# Patient Record
Sex: Male | Born: 1935 | Race: White | Hispanic: No | Marital: Married | Smoking: Former smoker
Health system: Southern US, Community
[De-identification: ages and names within clinical notes are randomized; demographics above are authoritative.]

## PROBLEM LIST (undated history)

## (undated) DIAGNOSIS — H409 Unspecified glaucoma: Secondary | ICD-10-CM

## (undated) DIAGNOSIS — I10 Essential (primary) hypertension: Secondary | ICD-10-CM

## (undated) DIAGNOSIS — N400 Enlarged prostate without lower urinary tract symptoms: Secondary | ICD-10-CM

## (undated) DIAGNOSIS — R630 Anorexia: Secondary | ICD-10-CM

## (undated) HISTORY — PX: HERNIA REPAIR: SHX51

---

## 2013-04-23 ENCOUNTER — Ambulatory Visit: Payer: Self-pay | Admitting: Family Medicine

## 2015-04-15 ENCOUNTER — Other Ambulatory Visit: Payer: Self-pay | Admitting: Family Medicine

## 2015-04-15 ENCOUNTER — Ambulatory Visit
Admission: RE | Admit: 2015-04-15 | Discharge: 2015-04-15 | Disposition: A | Discharge status: Home / Self Care | Payer: Medicare Other | Source: Ambulatory Visit | Attending: Family Medicine | Admitting: Family Medicine

## 2015-04-15 DIAGNOSIS — D72829 Elevated white blood cell count, unspecified: Secondary | ICD-10-CM | POA: Diagnosis present

## 2015-04-15 DIAGNOSIS — J449 Chronic obstructive pulmonary disease, unspecified: Secondary | ICD-10-CM | POA: Diagnosis not present

## 2015-04-21 ENCOUNTER — Ambulatory Visit: Payer: Medicare Other

## 2015-04-21 ENCOUNTER — Ambulatory Visit
Admission: EM | Admit: 2015-04-21 | Discharge: 2015-04-21 | Disposition: A | Discharge status: Home / Self Care | Payer: Medicare Other | Attending: Family Medicine | Admitting: Family Medicine

## 2015-04-21 DIAGNOSIS — Z87891 Personal history of nicotine dependence: Secondary | ICD-10-CM | POA: Insufficient documentation

## 2015-04-21 DIAGNOSIS — W458XXA Other foreign body or object entering through skin, initial encounter: Secondary | ICD-10-CM | POA: Diagnosis not present

## 2015-04-21 DIAGNOSIS — S61411A Laceration without foreign body of right hand, initial encounter: Secondary | ICD-10-CM | POA: Diagnosis present

## 2015-04-21 DIAGNOSIS — I10 Essential (primary) hypertension: Secondary | ICD-10-CM | POA: Diagnosis not present

## 2015-04-21 DIAGNOSIS — N400 Enlarged prostate without lower urinary tract symptoms: Secondary | ICD-10-CM | POA: Insufficient documentation

## 2015-04-21 DIAGNOSIS — H409 Unspecified glaucoma: Secondary | ICD-10-CM | POA: Insufficient documentation

## 2015-04-21 HISTORY — DX: Anorexia: R63.0

## 2015-04-21 HISTORY — DX: Essential (primary) hypertension: I10

## 2015-04-21 HISTORY — DX: Benign prostatic hyperplasia without lower urinary tract symptoms: N40.0

## 2015-04-21 HISTORY — DX: Unspecified glaucoma: H40.9

## 2015-04-21 MED ORDER — BACITRACIN ZINC 500 UNIT/GM EX OINT: TOPICAL_OINTMENT | Freq: Two times a day (BID) | CUTANEOUS | Status: DC

## 2015-04-21 NOTE — Discharge Instructions (Signed)
Keep clean. Clean daily with soap and water, rinse, then pat dry and apply thin layer of topical antibiotic ointment. Elevate today.   REturn to Urgent care or Primary care physician in 3 days for wound check. Return to Urgent care or primary care physician office in 7 days for suture removal. Return sooner for redness, drainage, new or worsening concerns.   Laceration Care, Adult A laceration is a cut that goes through all layers of the skin. The cut goes into the tissue beneath the skin. HOME CARE For stitches (sutures) or staples:  Keep the cut clean and dry.  If you have a bandage (dressing), change it at least once a day. Change the bandage if it gets wet or dirty, or as told by your doctor.  Wash the cut with soap and water 2 times a day. Rinse the cut with water. Pat it dry with a clean towel.  Put a thin layer of medicated cream on the cut as told by your doctor.  You may shower after the first 24 hours. Do not soak the cut in water until the stitches are removed.  Only take medicines as told by your doctor.  Have your stitches or staples removed as told by your doctor. For skin adhesive strips:  Keep the cut clean and dry.  Do not get the strips wet. You may take a bath, but be careful to keep the cut dry.  If the cut gets wet, pat it dry with a clean towel.  The strips will fall off on their own. Do not remove the strips that are still stuck to the cut. For wound glue:  You may shower or take baths. Do not soak or scrub the cut. Do not swim. Avoid heavy sweating until the glue falls off on its own. After a shower or bath, pat the cut dry with a clean towel.  Do not put medicine on your cut until the glue falls off.  If you have a bandage, do not put tape over the glue.  Avoid lots of sunlight or tanning lamps until the glue falls off. Put sunscreen on the cut for the first year to reduce your scar.  The glue will fall off on its own. Do not pick at the glue. You  may need a tetanus shot if:  You cannot remember when you had your last tetanus shot.  You have never had a tetanus shot. If you need a tetanus shot and you choose not to have one, you may get tetanus. Sickness from tetanus can be serious. GET HELP RIGHT AWAY IF:   Your pain does not get better with medicine.  Your arm, hand, leg, or foot loses feeling (numbness) or changes color.  Your cut is bleeding.  Your joint feels weak, or you cannot use your joint.  You have painful lumps on your body.  Your cut is red, puffy (swollen), or painful.  You have a red line on the skin near the cut.  You have yellowish-white fluid (pus) coming from the cut.  You have a fever.  You have a bad smell coming from the cut or bandage.  Your cut breaks open before or after stitches are removed.  You notice something coming out of the cut, such as wood or glass.  You cannot move a finger or toe. MAKE SURE YOU:   Understand these instructions.  Will watch your condition.  Will get help right away if you are not doing well or get  worse. Document Released: 01/19/2008 Document Revised: 10/25/2011 Document Reviewed: 01/26/2011 [facility] Patient Information 2015 [place], [place]. This information is not intended to replace advice given to you by your health care provider. Make sure you discuss any questions you have with your health care provider.

## 2015-04-21 NOTE — ED Provider Notes (Signed)
[facility] Emergency Department Provider Note  ____________________________________________  Time seen: Approximately 3:25 PM  I have reviewed the triage vital signs and the nursing notes.   HISTORY  Chief Complaint Laceration   HPI Baudelio Karnes is a 81 y.o. male presents with wife at bedside for complaints of right hand laceration. Patient and wife reports patient had his hand holding to the top of the car door frame where window closes. Wife reports she was closing window and didn't realize he had his hand there and the window partially closed on his hand causing laceration. Reports occurred just prior to arrival. Patient states right hand was mildly painful but now does not hurt. Denies other pain or injuries. Reports last tetanus immunization was 2 years ago.   PCP: Quillian Quince   Past Medical History  Diagnosis Date  . Hypertension   . BPH (benign prostatic hyperplasia)   . Glaucoma   . Anorexia     There are no active problems to display for this patient.   Past Surgical History  Procedure Laterality Date  . Hernia repair      No current outpatient prescriptions on file. Wife reports "blood pressure medication and an antibiotic because his white count was up" Denies blood thinners.  Allergies Review of patient's allergies indicates no known allergies.  History reviewed. No pertinent family history.  Social History Social History  Substance Use Topics  . Smoking status: Former Games developer  . Smokeless tobacco: None  . Alcohol Use: Yes     Comment:  weaning off beer    Review of Systems Constitutional: No fever/chills Eyes: No visual changes. ENT: No sore throat. Cardiovascular: Denies chest pain. Respiratory: Denies shortness of breath. Gastrointestinal: No abdominal pain.  No nausea, no vomiting.  No diarrhea.  No constipation. Genitourinary: Negative for dysuria. Musculoskeletal: Negative for back pain. Skin: Negative for rash. Right hand  laceration.  Neurological: Negative for headaches, focal weakness or numbness.  10-point ROS otherwise negative.  ____________________________________________   PHYSICAL EXAM:  VITAL SIGNS: ED Triage Vitals  Enc Vitals Group     BP 04/21/15 1403 131/64 mmHg     Pulse Rate 04/21/15 1403 92     Resp 04/21/15 1403 17     Temp 04/21/15 1403 97.2 F (36.2 C)     Temp Source 04/21/15 1403 Tympanic     SpO2 04/21/15 1403 100 %     Weight 04/21/15 1403 95 lb (43.092 kg)     Height 04/21/15 1403 5\' 4"  (1.626 m)     Head Cir --      Peak Flow --      Pain Score 04/21/15 1408 5     Pain Loc --      Pain Edu? --      Excl. in GC? --     Constitutional: Alert and oriented. Well appearing and in no acute distress. Eyes: Conjunctivae are normal. PERRL. EOMI. Head: Atraumatic.  Nose: No congestion/rhinnorhea.  Mouth/Throat: Mucous membranes are moist.   Neck: No stridor.  No cervical spine tenderness to palpation. Hematological/Lymphatic/Immunilogical: No cervical lymphadenopathy. Cardiovascular: Normal rate, regular rhythm. Grossly normal heart sounds.  Good peripheral circulation. Respiratory: Normal respiratory effort.  No retractions. Lungs CTAB. Musculoskeletal: No lower or upper extremity tenderness nor edema.  No joint effusions. Bilateral pedal pulses equal and easily palpated.  Except: right dorsal hand mild TTP with laceration present, right hand no tendon or motor deficit, sensation intact, bilateral hand grips equal, bilateral distal radial pulses equal  and easily palpated. Dorsal right hand laceration with 5.5 cm flap laceration. Also with right 4th finger lateral 2 cm superficial skin tear.  Neurologic:  Normal speech and language. No gross focal neurologic deficits are appreciated. No gait instability. Skin:  Skin is warm, dry and intact. No rash noted. Psychiatric: Mood and affect are normal. Speech and behavior are normal.  ____________________________________________    LABS (all labs ordered are listed, but only abnormal results are displayed)  Labs Reviewed - No data to display  RADIOLOGY EXAM: RIGHT HAND - COMPLETE 3+ VIEW  COMPARISON: None.  FINDINGS: Negative for fracture or dislocation. No foreign body. No significant arthropathy. Arterial calcification.  IMPRESSION: Negative for fracture.   Electronically Signed By: Marlan Palau M.D. On: 04/21/2015 14:40  I, Renford Dills, personally viewed and evaluated these images (plain radiographs) as part of my medical decision making.    ____________________________________________   PROCEDURES  Procedure(s) performed:   Procedure explained and verbal consent obtained.  Anesthesia with 1% Lidocaine.  Wound cleansed and irrigated with saline and betadine. Copious irrigation.  Location: right dorsal hand Length  5.5 cm Repaired sterile technique  with x 7 5-0 nylon sutures, simple interrupted.   Location: Right 4th lateral finger Length: 2 cm Cleaned and irrigated with betadine and saline.  No suture indicated. Superficial skin tear repaired with x 2 steri strips.    Antibiotic ointment and dressing applied.  Wound care instructions provided.  Observe for any signs of infection or other problems. Patient tolerated well.   ____________________________________________   INITIAL IMPRESSION / ASSESSMENT AND PLAN / ED COURSE  Pertinent labs & imaging results that were available during my care of the patient were reviewed by me and considered in my medical decision making (see chart for details).  Presents for right hand laceration post car window cutting right hand. Xray of hand negative for fracture. Laceration repaired. PAtient tolerated well. Discussed follow up and return parameters. Wound check in 3 days. Suture removal in 7 days. Patient and wife verbalized understanding and agreed to plan.  ____________________________________________   FINAL CLINICAL  IMPRESSION(S) / ED DIAGNOSES  Final diagnoses:  Hand laceration, right, initial encounter       Renford Dills, NP 04/21/15 1644

## 2015-04-21 NOTE — ED Notes (Addendum)
Passenger in car, wife accidentally closed car window on patient's hand. Has deep v shaped (skin tear) top of right hand. Can see tendon. Been on an unknown antibiotic for unknown infection "somewhere". Being treated also for anorexia. On 2 medications for HTN, Prostate medication and 2 different gtts for glaucoma

## 2016-07-24 ENCOUNTER — Encounter: Payer: Self-pay | Admitting: *Deleted

## 2016-07-24 ENCOUNTER — Emergency Department: Payer: Medicare Other

## 2016-07-24 ENCOUNTER — Emergency Department
Admission: EM | Admit: 2016-07-24 | Discharge: 2016-07-24 | Disposition: A | Discharge status: Other Acute Inpatient Hospital | Payer: Medicare Other | Attending: Emergency Medicine | Admitting: Emergency Medicine

## 2016-07-24 DIAGNOSIS — I10 Essential (primary) hypertension: Secondary | ICD-10-CM | POA: Insufficient documentation

## 2016-07-24 DIAGNOSIS — J181 Lobar pneumonia, unspecified organism: Secondary | ICD-10-CM | POA: Diagnosis not present

## 2016-07-24 DIAGNOSIS — Z87891 Personal history of nicotine dependence: Secondary | ICD-10-CM | POA: Insufficient documentation

## 2016-07-24 DIAGNOSIS — Z79899 Other long term (current) drug therapy: Secondary | ICD-10-CM | POA: Diagnosis not present

## 2016-07-24 DIAGNOSIS — Z7901 Long term (current) use of anticoagulants: Secondary | ICD-10-CM | POA: Insufficient documentation

## 2016-07-24 DIAGNOSIS — R0602 Shortness of breath: Secondary | ICD-10-CM | POA: Diagnosis present

## 2016-07-24 DIAGNOSIS — R791 Abnormal coagulation profile: Secondary | ICD-10-CM | POA: Diagnosis not present

## 2016-07-24 DIAGNOSIS — J189 Pneumonia, unspecified organism: Secondary | ICD-10-CM

## 2016-07-24 LAB — BASIC METABOLIC PANEL
ANION GAP: 10 (ref 5–15)
BUN: 15 mg/dL (ref 6–20)
CALCIUM: 8.7 mg/dL — AB (ref 8.9–10.3)
CO2: 27 mmol/L (ref 22–32)
Chloride: 102 mmol/L (ref 101–111)
Creatinine, Ser: 0.69 mg/dL (ref 0.61–1.24)
Glucose, Bld: 141 mg/dL — ABNORMAL HIGH (ref 65–99)
Potassium: 3.4 mmol/L — ABNORMAL LOW (ref 3.5–5.1)
Sodium: 139 mmol/L (ref 135–145)

## 2016-07-24 LAB — CBC WITH DIFFERENTIAL/PLATELET
BASOS ABS: 0 10*3/uL (ref 0–0.1)
Basophils Relative: 0 %
EOS PCT: 0 %
Eosinophils Absolute: 0 10*3/uL (ref 0–0.7)
HEMATOCRIT: 40.7 % (ref 40.0–52.0)
Hemoglobin: 13.6 g/dL (ref 13.0–18.0)
LYMPHS PCT: 3 %
Lymphs Abs: 0.7 10*3/uL — ABNORMAL LOW (ref 1.0–3.6)
MCH: 31.8 pg (ref 26.0–34.0)
MCHC: 33.4 g/dL (ref 32.0–36.0)
MCV: 95.4 fL (ref 80.0–100.0)
Monocytes Absolute: 0.8 10*3/uL (ref 0.2–1.0)
Monocytes Relative: 3 %
NEUTROS ABS: 25.8 10*3/uL — AB (ref 1.4–6.5)
Neutrophils Relative %: 94 %
PLATELETS: 395 10*3/uL (ref 150–440)
RBC: 4.27 MIL/uL — AB (ref 4.40–5.90)
RDW: 14.3 % (ref 11.5–14.5)
WBC: 27.3 10*3/uL — AB (ref 3.8–10.6)

## 2016-07-24 LAB — BRAIN NATRIURETIC PEPTIDE: B Natriuretic Peptide: 312 pg/mL — ABNORMAL HIGH (ref 0.0–100.0)

## 2016-07-24 LAB — LACTIC ACID, PLASMA
LACTIC ACID, VENOUS: 1.7 mmol/L (ref 0.5–1.9)
Lactic Acid, Venous: 1.2 mmol/L (ref 0.5–1.9)

## 2016-07-24 LAB — PROTIME-INR
INR: 1.65
Prothrombin Time: 19.7 seconds — ABNORMAL HIGH (ref 11.4–15.2)

## 2016-07-24 MED ORDER — ENOXAPARIN SODIUM 60 MG/0.6ML ~~LOC~~ SOLN
1.0000 mg/kg | Freq: Once | SUBCUTANEOUS | Status: AC
  Administered 2016-07-24: 16:00:00 55 mg via SUBCUTANEOUS
  Filled 2016-07-24: qty 0.6

## 2016-07-24 MED ORDER — PIPERACILLIN-TAZOBACTAM 3.375 G IVPB 30 MIN
3.3750 g | Freq: Once | INTRAVENOUS | Status: AC
  Administered 2016-07-24: 3.375 g via INTRAVENOUS
  Filled 2016-07-24: qty 50

## 2016-07-24 MED ORDER — VANCOMYCIN HCL IN DEXTROSE 1-5 GM/200ML-% IV SOLN
1000.0000 mg | Freq: Once | INTRAVENOUS | Status: AC
  Administered 2016-07-24: 1000 mg via INTRAVENOUS
  Filled 2016-07-24: qty 200

## 2016-07-24 MED ORDER — SODIUM CHLORIDE 0.9 % IV BOLUS (SEPSIS)
1000.0000 mL | Freq: Once | INTRAVENOUS | Status: AC
  Administered 2016-07-24: 1000 mL via INTRAVENOUS

## 2016-07-24 NOTE — ED Notes (Signed)
Patient left via [facility]. 

## 2016-07-24 NOTE — ED Notes (Signed)
Family declined chest x-ray at this time. Family states patient was just discharged from [facility] yesterday, and they want him transferred back there for treatment. Dr. Shaune PollackLord aware.

## 2016-07-24 NOTE — ED Triage Notes (Signed)
Per EMS report, EMS was called to [facility] for c/o shortness of breath. Patient was recently diagnosed with aspiration pneumonia. Pulse ox was 82% on 2L upon EMS arrival. Patient was given Duoneb treatment and albuterol treatment and 125mg  Solumedrol IV enroute. Patient has faint wheezing, not much air exchanged noted. Patient was suctioned with yankauer upon arrival due to gurgling and clear mucous was obtained.

## 2016-07-24 NOTE — ED Notes (Signed)
Patient suctioned with yankauer of clear secretions.

## 2016-07-24 NOTE — ED Notes (Signed)
Patient suctioned clear mucous. Lemon swabs were used to freshen mouth. Patient tolerated procedure well.

## 2016-07-24 NOTE — ED Provider Notes (Addendum)
[facility] Emergency Department Provider Note ____________________________________________   I have reviewed the triage vital signs and the triage nursing note.  HISTORY  Chief Complaint Shortness of Breath   Historian Patient is a limited historian based on his muffled voice due to breathing issues Wife and daughter provides most of the history  HPI Tyler Francis is a 82 y.o. male brought in by EMS from nursing home for worsening trouble breathing, gurgling with breathing, and new decreased O2 sat reported at 80% on room air. Patient was discharged yesterday from [facility] after acute hypoxic and hypercarbic respiratory failure requiring BiPAP and then weaned to nasal cannula and then to room air prior to discharge. Review of discharge summary and discussion with the family indicates patient had a likely choking episode followed by a diagnosis of aspiration pneumonia, multiple pulmonary emboli and started on Coumadin, new onset atrial fibrillation and started on diltiazem/Coumadin and discharged to nursing home yesterday.  Family states he was doing much better yesterday and the day before, and this morning looked much worse in terms of a gurgling sound with each breath, patient's complaint of shortness of breath, and certainly the oxygen requirement.  Patient denies chest pain. There is no report of new fevers.    Past Medical History:  Diagnosis Date  . Anorexia   . BPH (benign prostatic hyperplasia)   . Glaucoma   . Hypertension     There are no active problems to display for this patient.   Past Surgical History:  Procedure Laterality Date  . HERNIA REPAIR      Prior to Admission medications   Medication Sig Start Date End Date Taking? Authorizing Provider  acetaminophen (TYLENOL) 325 MG tablet Take 650 mg by mouth every 6 (six) hours as needed.   Yes Historical Provider, MD  albuterol (PROVENTIL HFA;VENTOLIN HFA) 108 (90 Base) MCG/ACT  inhaler Inhale 2 puffs into the lungs every 6 (six) hours as needed for wheezing or shortness of breath.   Yes Historical Provider, MD  amoxicillin-clavulanate (AUGMENTIN) 875-125 MG tablet Take 1 tablet by mouth daily.   Yes Historical Provider, MD  brimonidine (ALPHAGAN) 0.2 % ophthalmic solution Place 1 drop into both eyes daily.   Yes Historical Provider, MD  diltiazem (DILACOR XR) 180 MG 24 hr capsule Take 180 mg by mouth daily.   Yes Historical Provider, MD  dorzolamide-timolol (COSOPT) 22.3-6.8 MG/ML ophthalmic solution Place 1 drop into both eyes 2 (two) times daily.   Yes Historical Provider, MD  enoxaparin (LOVENOX) 40 MG/0.4ML injection Inject 40 mg into the skin 2 (two) times daily. At 0900 and 1800   Yes Historical Provider, MD  furosemide (LASIX) 20 MG tablet Take 20 mg by mouth as needed (swelling).   Yes Historical Provider, MD  megestrol (MEGACE) 400 MG/10ML suspension Take 400 mg by mouth daily.   Yes Historical Provider, MD  tamsulosin (FLOMAX) 0.4 MG CAPS capsule Take 0.4 mg by mouth at bedtime.   Yes Historical Provider, MD  traZODone (DESYREL) 150 MG tablet Take 150 mg by mouth at bedtime.   Yes Historical Provider, MD  warfarin (COUMADIN) 2.5 MG tablet Take 2.5 mg by mouth daily.   Yes Historical Provider, MD    No Known Allergies  No family history on file.  Social History Social History  Substance Use Topics  . Smoking status: Former Games developermoker  . Smokeless tobacco: Not on file  . Alcohol use Yes     Comment:  weaning off beer  Review of Systems  Constitutional: Negative for fever. Eyes: Negative for visual changes. ENT: Negative for sore throat. Cardiovascular: Negative for chest pain. Respiratory: Positive for shortness of breath. Gastrointestinal: Negative for abdominal pain, vomiting and diarrhea. Genitourinary:  Musculoskeletal: Negative for back pain. Skin: Negative for rash. Neurological: Negative for headache. 10 point Review of Systems otherwise  negative ____________________________________________   PHYSICAL EXAM:  VITAL SIGNS: ED Triage Vitals  Enc Vitals Group     BP 07/24/16 1121 136/81     Pulse Rate 07/24/16 1121 (!) 106     Resp 07/24/16 1121 20     Temp 07/24/16 1121 (!) 96.8 F (36 C)     Temp Source 07/24/16 1121 Axillary     SpO2 07/24/16 1121 100 %     Weight 07/24/16 1123 122 lb (55.3 kg)     Height --      Head Circumference --      Peak Flow --      Pain Score --      Pain Loc --      Pain Edu? --      Excl. in GC? --      Constitutional: Alert and Cooperative.  HEENT   Head: Normocephalic and atraumatic.      Eyes: Conjunctivae are normal. PERRL. Normal extraocular movements.      Ears:         Nose: No congestion/rhinnorhea.   Mouth/Throat: Mucous membranes are moderately dry.   Neck: No stridor. Cardiovascular/Chest: Tachycardic irregularly irregular rhythm.  No murmurs, rubs, or gallops. Respiratory: Some gurgling when patient is talking, very thick raspy voice, and rhonchi throughout all lung fields. No tachypnea retractions. Gastrointestinal: Soft. No distention, no guarding, no rebound. Nontender.  Thin  Genitourinary/rectal:Deferred Musculoskeletal: Nontender with normal range of motion in all extremities. No joint effusions.  No lower extremity tenderness.  No edema. Neurologic:  No facial droop, no receptive aphasia. No gross or focal neurologic deficits are appreciated. Skin:  Skin is warm, dry and intact. No rash noted. Psychiatric: Mood and affect are normal. Speech and behavior are normal. Patient exhibits appropriate insight and judgment.   ____________________________________________  LABS (pertinent positives/negatives)  Labs Reviewed  CBC WITH DIFFERENTIAL/PLATELET - Abnormal; Notable for the following:       Result Value   WBC 27.3 (*)    RBC 4.27 (*)    Neutro Abs 25.8 (*)    Lymphs Abs 0.7 (*)    All other components within normal limits  BRAIN NATRIURETIC  PEPTIDE - Abnormal; Notable for the following:    B Natriuretic Peptide 312.0 (*)    All other components within normal limits  BASIC METABOLIC PANEL - Abnormal; Notable for the following:    Potassium 3.4 (*)    Glucose, Bld 141 (*)    Calcium 8.7 (*)    All other components within normal limits  PROTIME-INR - Abnormal; Notable for the following:    Prothrombin Time 19.7 (*)    All other components within normal limits  CULTURE, BLOOD (ROUTINE X 2)  CULTURE, BLOOD (ROUTINE X 2)  LACTIC ACID, PLASMA  LACTIC ACID, PLASMA    ____________________________________________    EKG I, Governor Rooksebecca Elexia Friedt, MD, the attending physician have personally viewed and interpreted all ECGs.  105 bpm. Sinus tachycardia. Narrow QRS. Normal axis. Nonspecific ST and T-wave ____________________________________________  RADIOLOGY All Xrays were viewed by me. Imaging interpreted by Radiologist.  Chest x-ray two-view:  IMPRESSION: Bilateral pleural effusions, right greater than left, with  underlying opacities. Left perihilar opacity, new since August 2016. Recommend follow-up to resolution. __________________________________________  PROCEDURES  Procedure(s) performed: None  Critical Care performed: None  ____________________________________________   ED COURSE / ASSESSMENT AND PLAN  Pertinent labs & imaging results that were available during my care of the patient were reviewed by me and considered in my medical decision making (see chart for details).   Mr. Barco was brought in EMS from nursing home recently been for 1 day after discharge from [facility] with aspiration pneumonia, as well as new onset A. fib, as well as PEs.  Hypoxia improved and stable on 2 L nasal cannula to 96-99%.  No focal neurologic deficits on exam or altered mental status.  No hypotension.  He does have a slight tachycardia. IV fluid bolus started after chest x-ray shows no evidence of pulmonary edema.  No  elevated lactate. Patient given 1 L normal saline bolus, and symptoms be followed clinically for fluid hydration/resuscitation.  We'll give a dose of Lovenox as INR is subtherapeutic.  Per family request after discussion of results and treating for worsening pneumonia with hypoxia, they request transfer back to [facility] for continuity of care. I spoke with on-call hospitalist accepted in transfer.    CONSULTATIONS:  Dr. Oletta Lamas, hospitalist accepted in transfer.   Patient / Family / Caregiver informed of clinical course, medical decision-making process, and agree with plan.  Addended to include, although some raspy voice, I have significant hesitation intubating this gentleman and discussed with family regarding oxygenation and mental status great and plan to treat without pursuing intubation although he did have an episode of aspiration last week, he's been eating and drinking fine.  ___________________________________________   FINAL CLINICAL IMPRESSION(S) / ED DIAGNOSES   Final diagnoses:  Pneumonia of left lung due to infectious organism, unspecified part of lung  Subtherapeutic international normalized ratio (INR)              Note: This dictation was prepared with Dragon dictation. Any transcriptional errors that result from this process are unintentional    Governor Rooks, MD 07/24/16 1459    Governor Rooks, MD 07/24/16 1505    Governor Rooks, MD 07/24/16 1505

## 2016-07-24 NOTE — ED Notes (Signed)
Scant amount of clear secretions suctioned from patient's throat. Patient tolerates suctioning well.

## 2016-07-24 NOTE — ED Notes (Signed)
Patient suctioned clear, mucous secretions.

## 2016-07-29 LAB — CULTURE, BLOOD (ROUTINE X 2)
CULTURE: NO GROWTH
Culture: NO GROWTH

## 2016-08-16 DEATH — deceased

## 2018-08-14 IMAGING — CR DG CHEST 2V
2 series · 2 of 2 positions shown · non-contrast
Comparison: April 15, 2015

CLINICAL DATA: Possible aspiration. Recent discharge from hospital.

EXAM:
CHEST  2 VIEW

[chest lat]
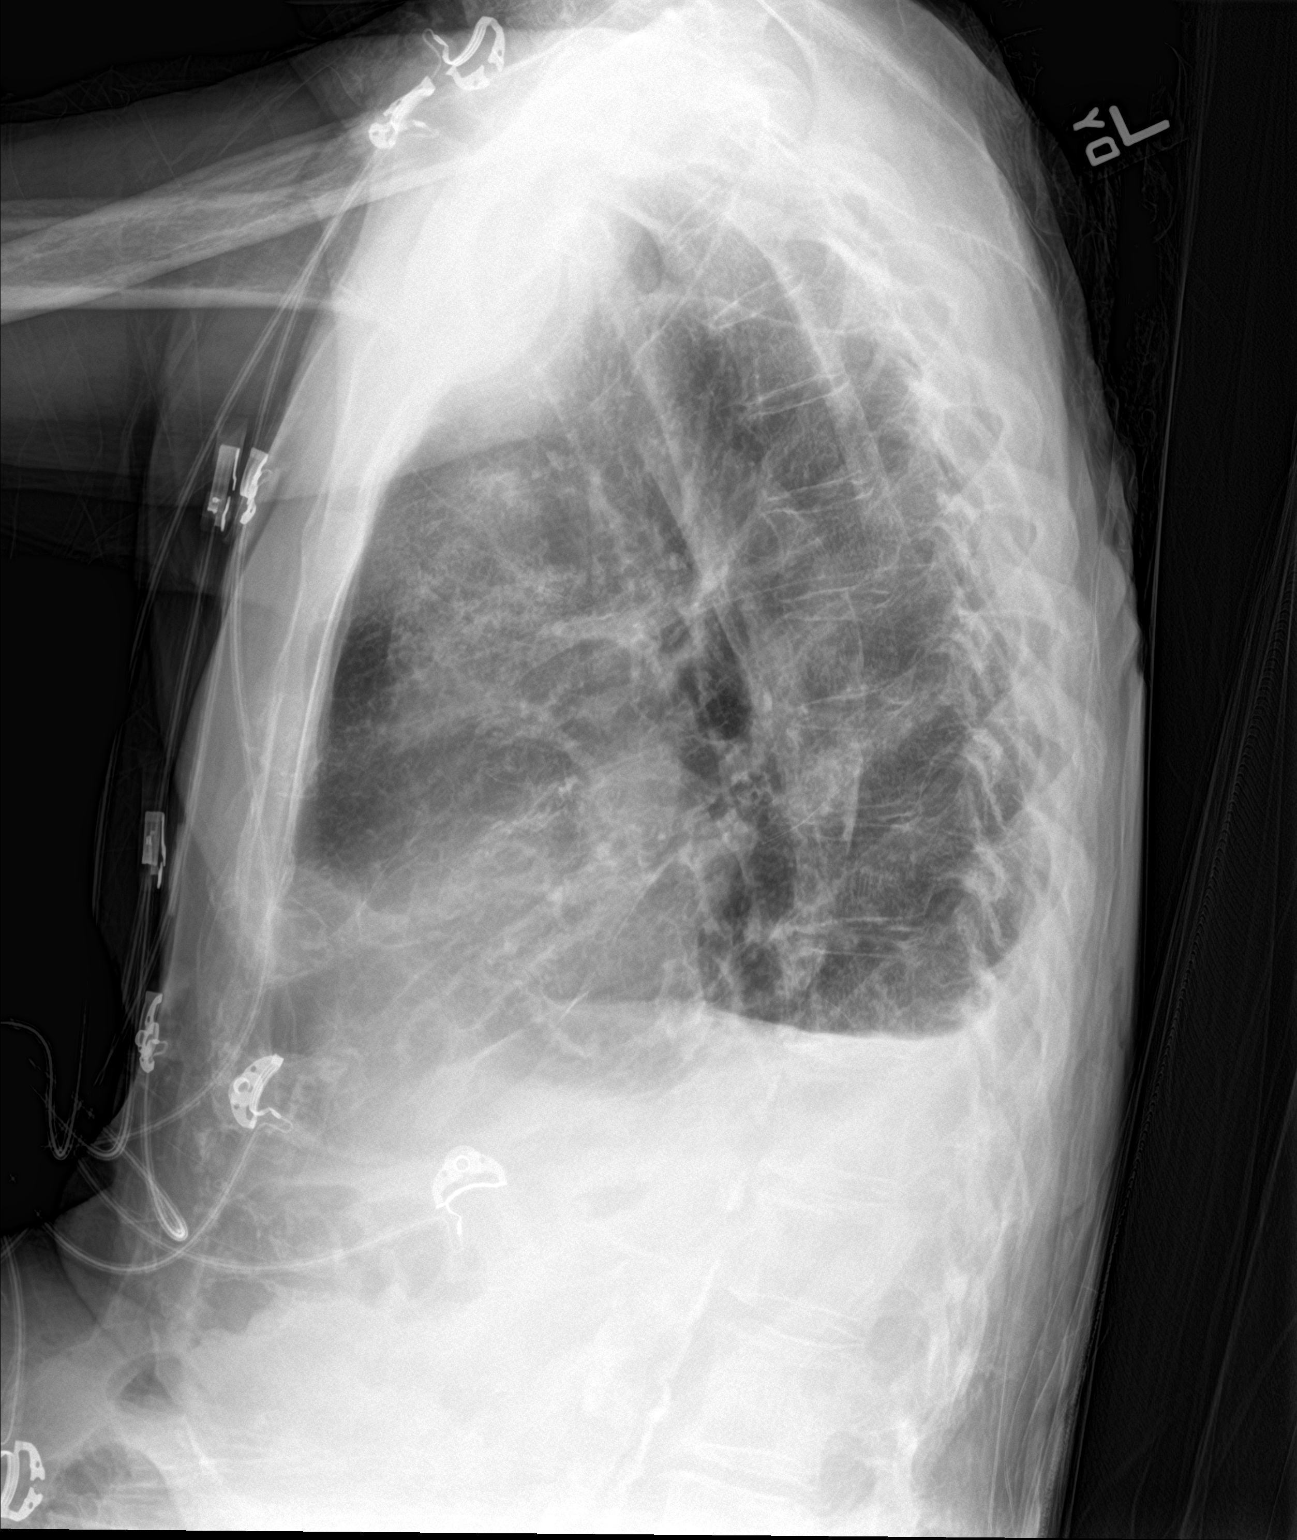

[chest ap]
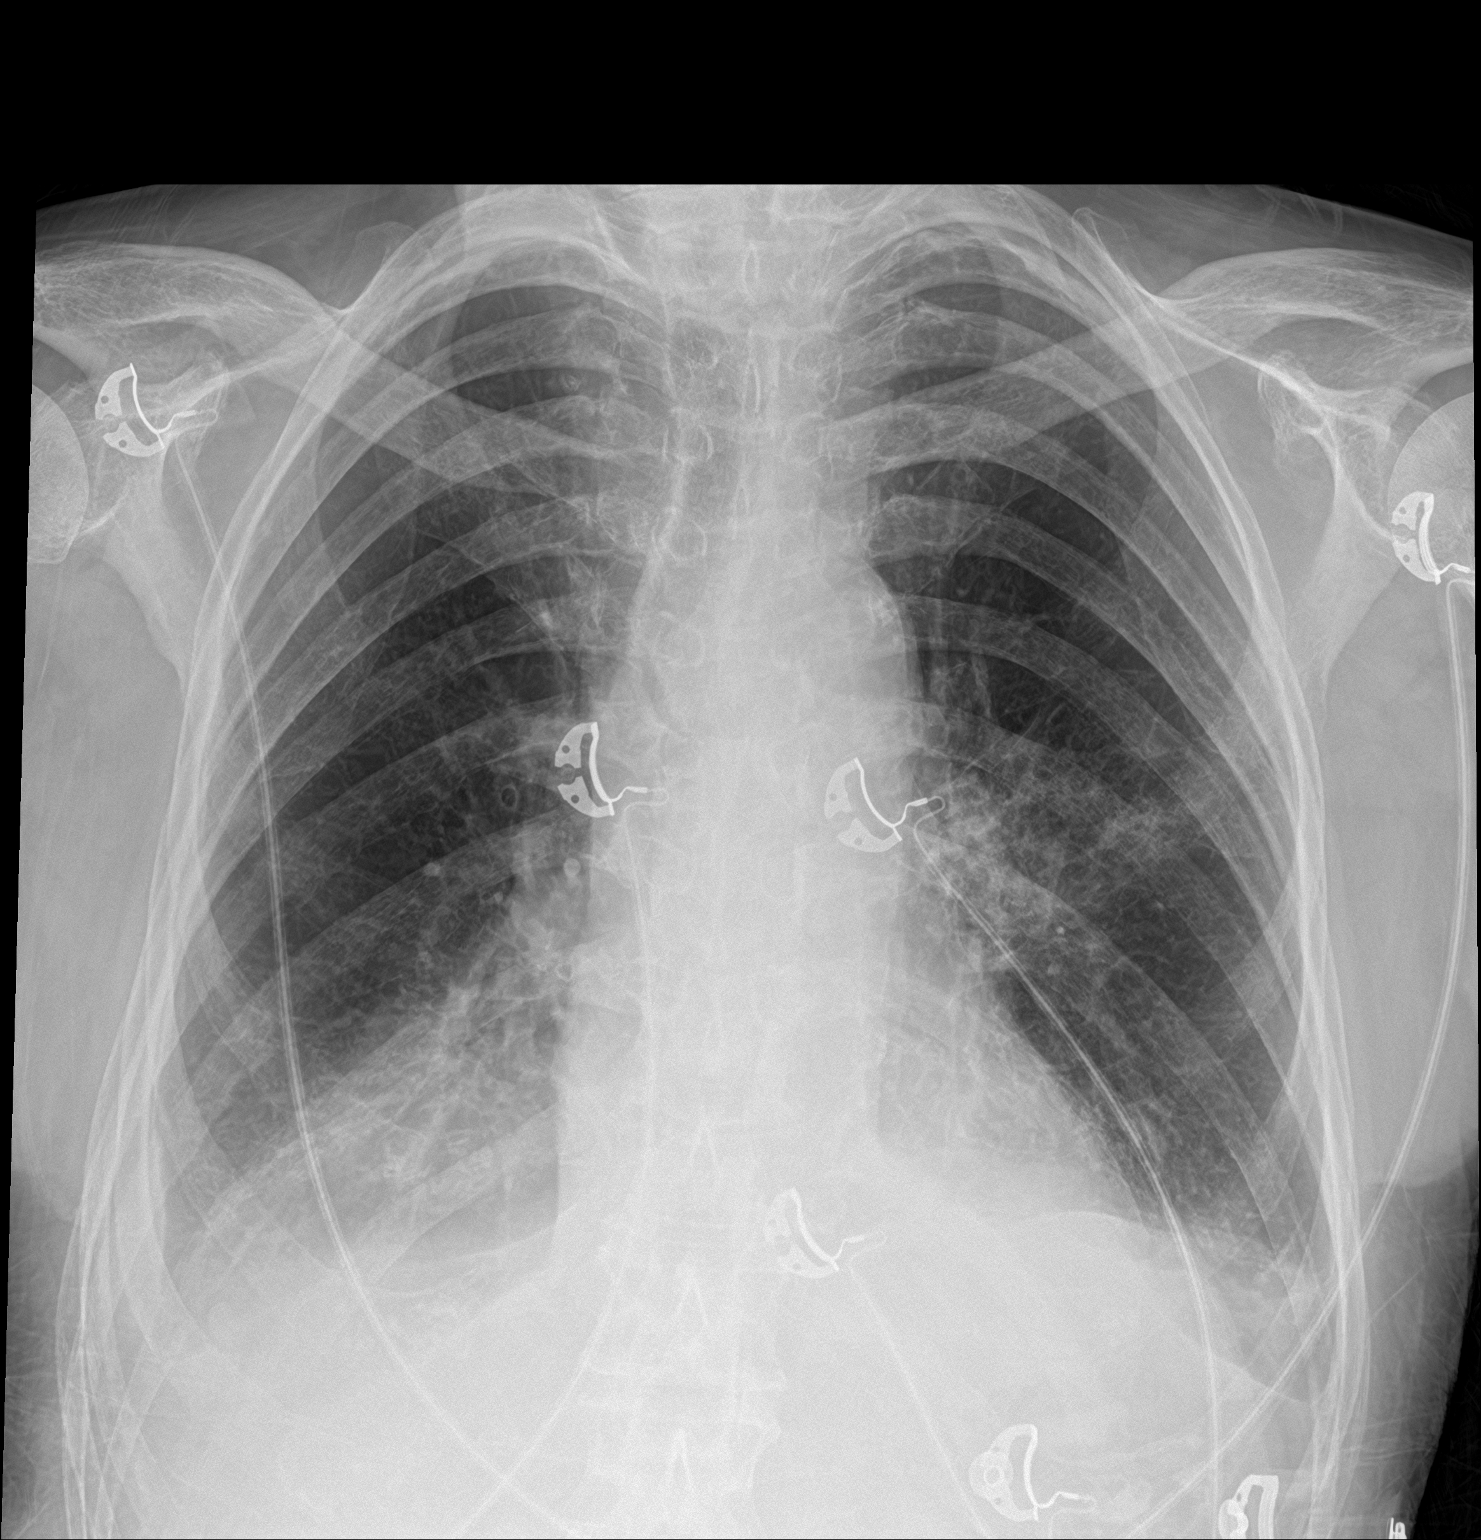

[2 of 2 positions shown; findings below may reference images not displayed]

FINDINGS: No pneumothorax. A right-sided pleural effusion with underlying
opacity is identified. New left perihilar opacity is seen as well.
There is probably a small effusion and underlying atelectasis on the
left as well. No other interval changes.
IMPRESSION: Bilateral pleural effusions, right greater than left, with
underlying opacities. Left perihilar opacity, new since March 2015.
Recommend follow-up to resolution.
